# Patient Record
Sex: Male | Born: 1960 | Race: White | Hispanic: No | Smoking: Never smoker
Health system: Southern US, Community
[De-identification: ages and names within clinical notes are randomized; demographics above are authoritative.]

## PROBLEM LIST (undated history)

## (undated) HISTORY — PX: NECK SURGERY: SHX720

---

## 2006-09-20 ENCOUNTER — Emergency Department (HOSPITAL_COMMUNITY): Admission: EM | Admit: 2006-09-20 | Discharge: 2006-09-20 | Payer: Self-pay | Admitting: Emergency Medicine

## 2006-10-02 ENCOUNTER — Encounter: Admission: RE | Admit: 2006-10-02 | Discharge: 2006-10-02 | Payer: Self-pay | Admitting: Internal Medicine

## 2006-10-13 ENCOUNTER — Ambulatory Visit (HOSPITAL_COMMUNITY): Admission: RE | Admit: 2006-10-13 | Discharge: 2006-10-14 | Payer: Self-pay | Admitting: Neurosurgery

## 2008-04-03 IMAGING — CR DG CERVICAL SPINE COMPLETE 4+V
7 series · 7 of 7 positions shown · non-contrast
Comparison: none

CLINICAL DATA: Right-sided neck pain and arm numbness.
 CERVICAL SPINE ? 6 VIEW:

[w c-spine lat]
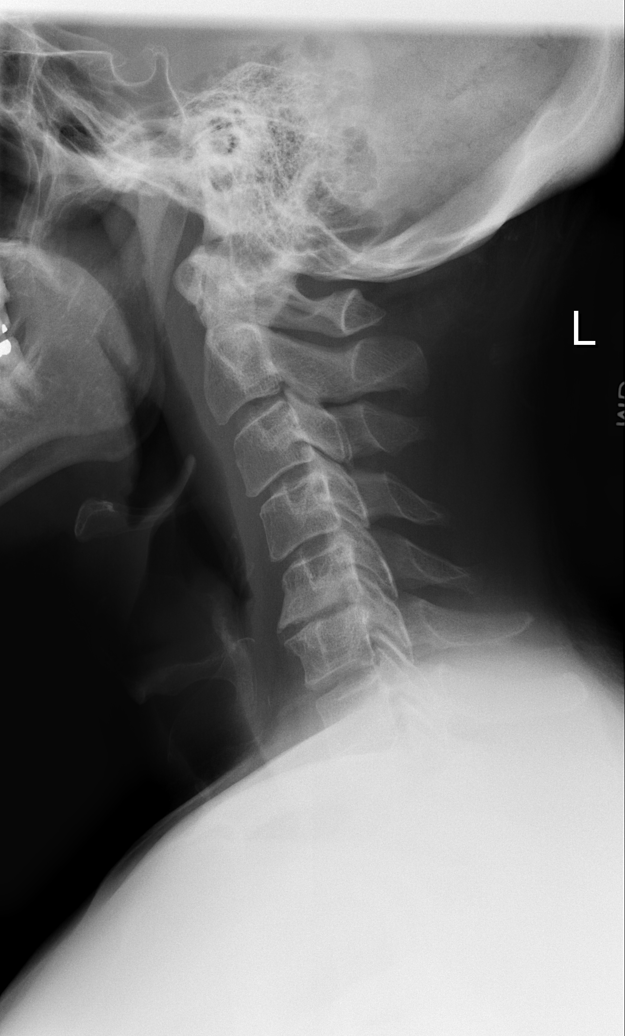

[w c-spine oblique * (1 of 2)]
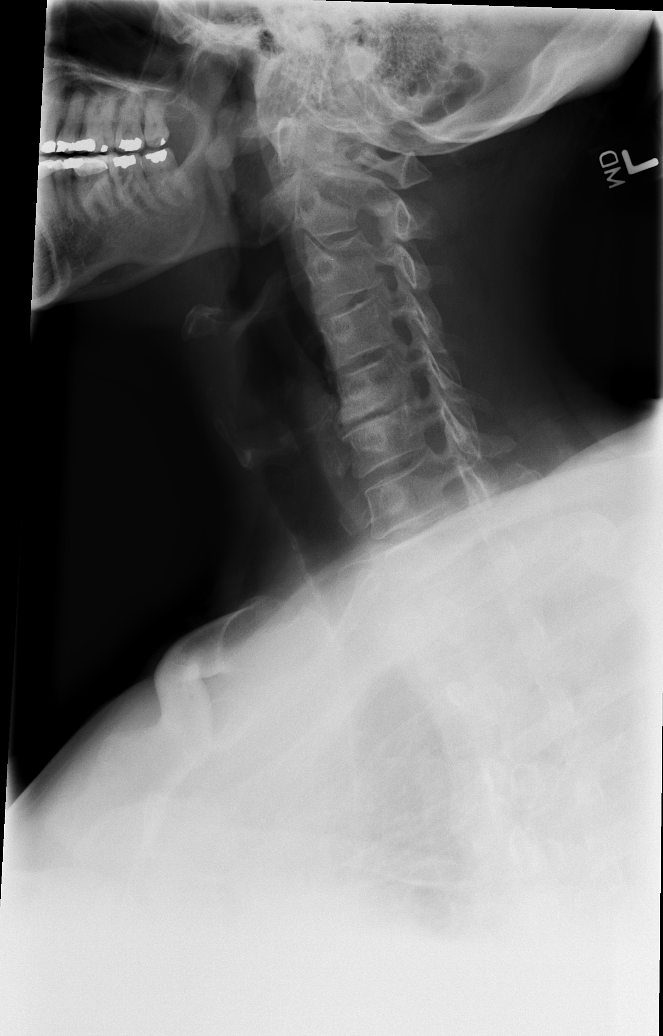

[w c-spine oblique * (2 of 2)]
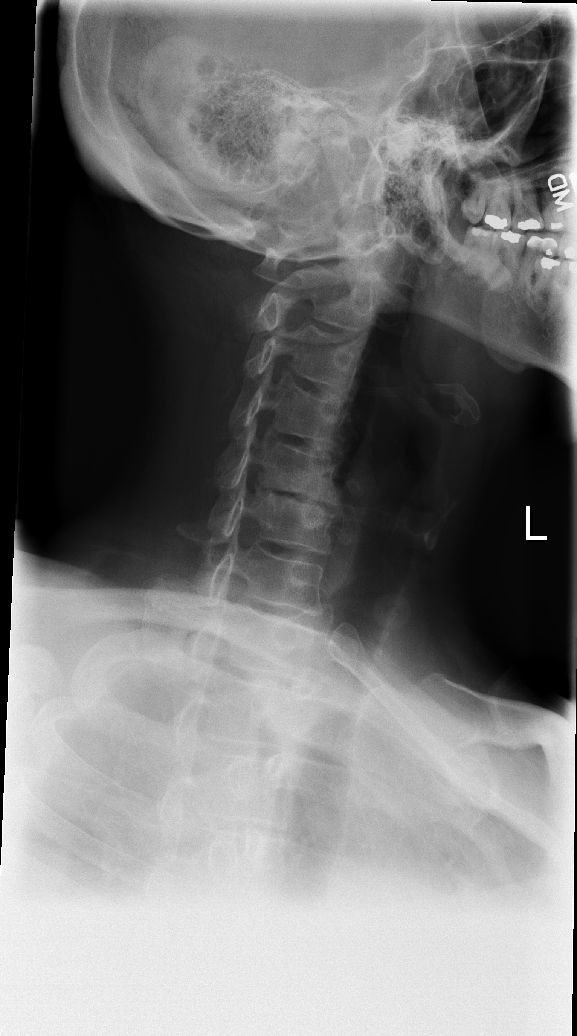

[w swimmers view]
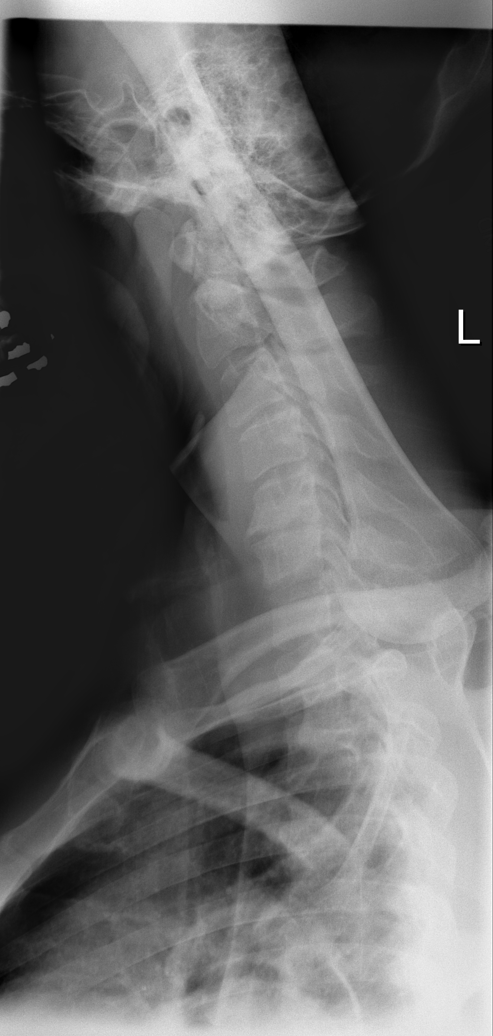

[w c-spine odontoid (1 of 2)]
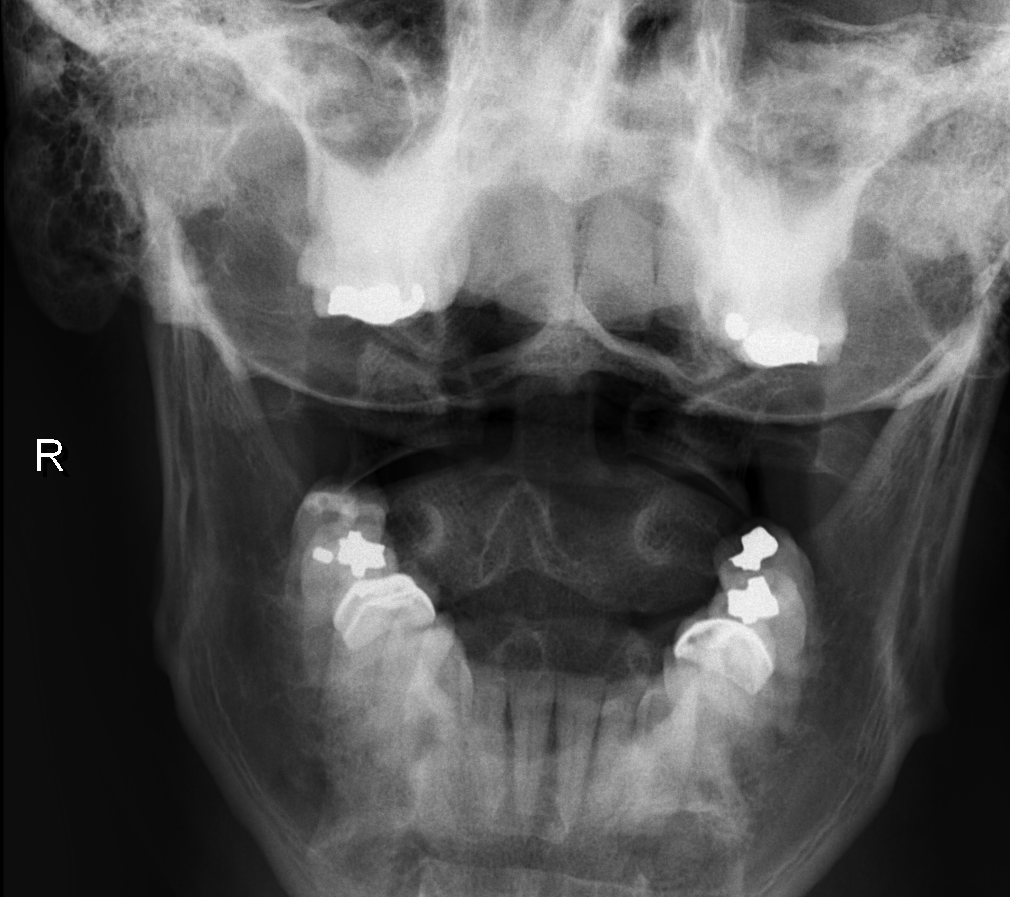

[w c-spine odontoid (2 of 2)]
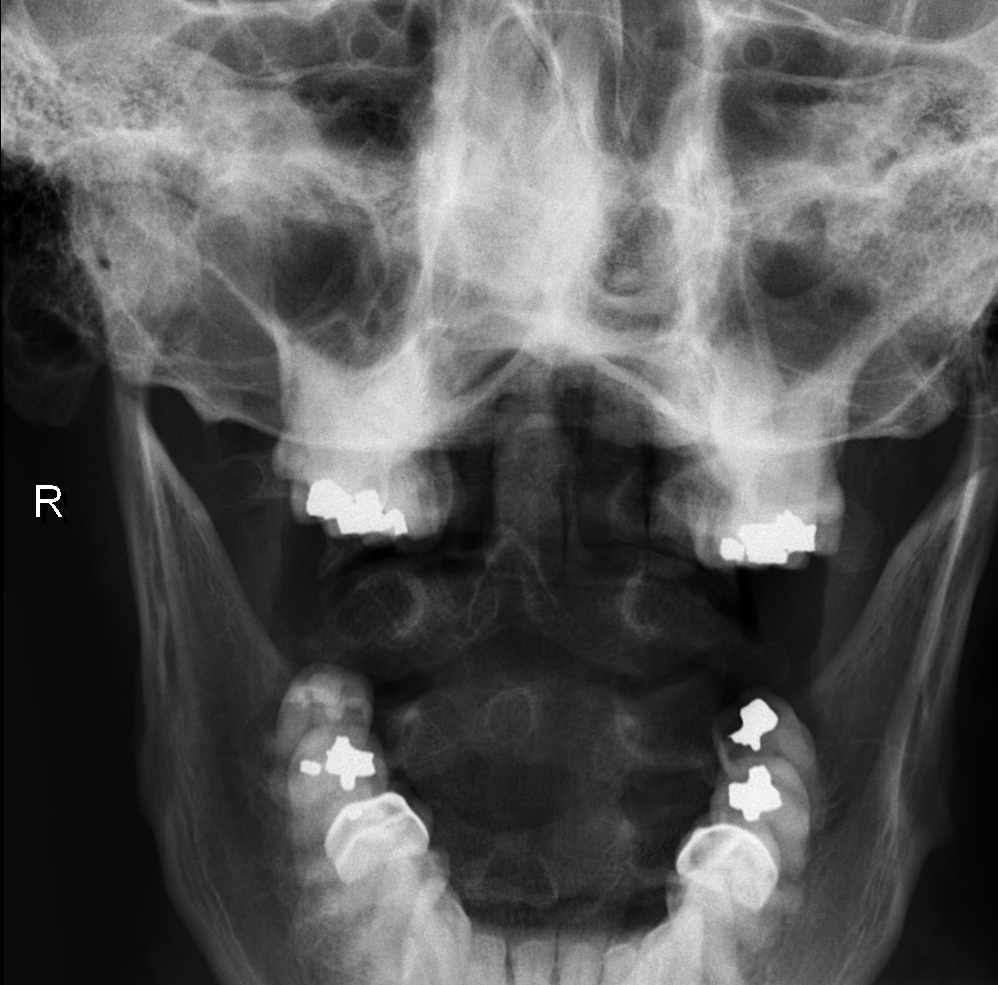

[w c-spine a.p.]
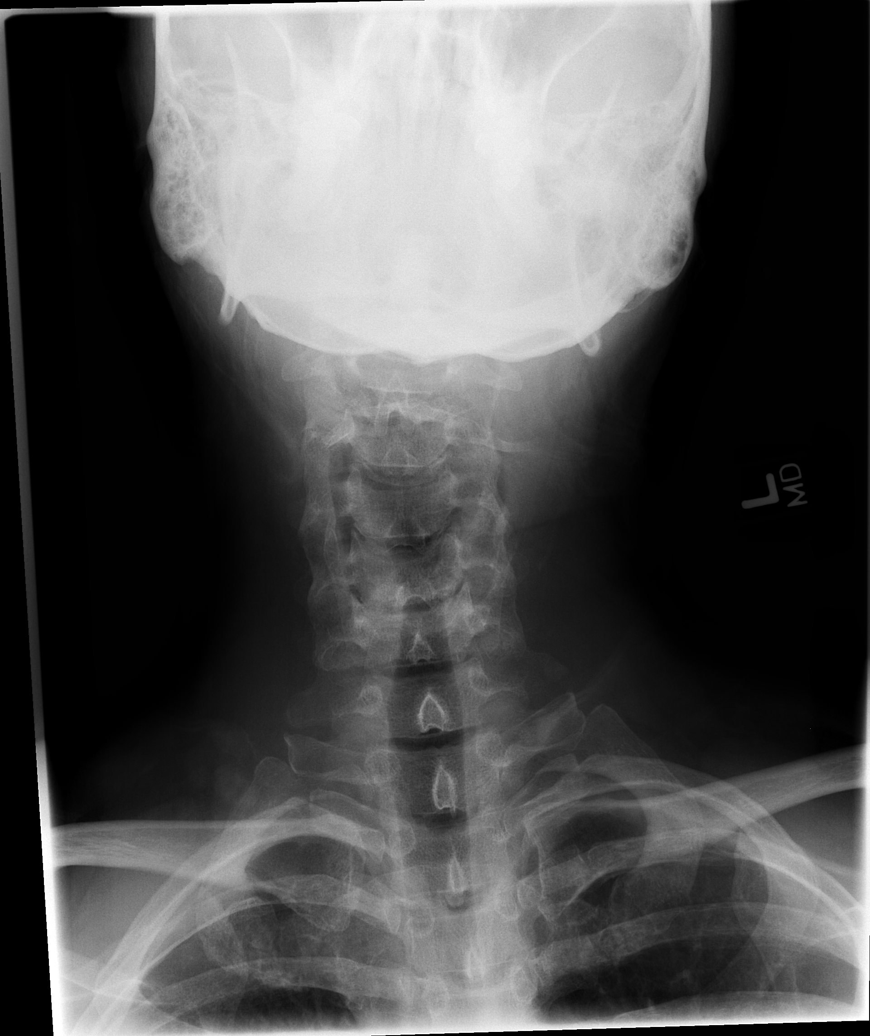

[7 of 7 positions shown; findings below may reference images not displayed]

FINDINGS: There is no evidence of cervical spine fracture, subluxation, or prevertebral soft tissue swelling.
 Moderate to severe degenerative disk disease and bilateral uncovertebral spurring is seen at C5-6.  This results in bilateral bony foraminal narrowing, right side greater than left.  No other significant bone abnormality is identified.
IMPRESSION: 1.  No acute findings.
 2.  Moderate degenerative disk disease and bilateral uncovertebral spurring at C5-6.  This results in bilateral foraminal narrowing, right side greater than left.

## 2020-10-04 ENCOUNTER — Ambulatory Visit: Payer: Self-pay | Attending: Internal Medicine

## 2020-10-04 DIAGNOSIS — Z23 Encounter for immunization: Secondary | ICD-10-CM

## 2020-10-04 NOTE — Progress Notes (Signed)
   Covid-[facility] Vaccination [facility]  Name:  Ricky Foley    MRN: [id number] DOB: [birth year]  10/04/2020  Mr. Ricky Foley was observed post Covid-19 immunization for 15 minutes without incident. He was provided with Vaccine Information Sheet and instruction to access the V-Safe system.   Mr. Ricky Foley was instructed to call 911 with any severe reactions post vaccine: Marland Kitchen Difficulty breathing  . Swelling of face and throat  . A fast heartbeat  . A bad rash all over body  . Dizziness and weakness   Immunizations Administered    No immunizations on file.

## 2023-03-07 ENCOUNTER — Ambulatory Visit (HOSPITAL_COMMUNITY)
Admission: RE | Admit: 2023-03-07 | Discharge: 2023-03-07 | Disposition: A | Discharge status: Home / Self Care | Payer: 59 | Source: Ambulatory Visit

## 2023-03-07 ENCOUNTER — Encounter (HOSPITAL_COMMUNITY): Payer: Self-pay

## 2023-03-07 VITALS — BP 180/102 | HR 99 | Temp 98.0°F | Resp 16 | Ht 69.0 in | Wt 178.0 lb

## 2023-03-07 DIAGNOSIS — H6692 Otitis media, unspecified, left ear: Secondary | ICD-10-CM | POA: Diagnosis not present

## 2023-03-07 DIAGNOSIS — R030 Elevated blood-pressure reading, without diagnosis of hypertension: Secondary | ICD-10-CM

## 2023-03-07 DIAGNOSIS — J320 Chronic maxillary sinusitis: Secondary | ICD-10-CM | POA: Diagnosis not present

## 2023-03-07 MED ORDER — CEFTRIAXONE SODIUM 1 G IJ SOLR
INTRAMUSCULAR | Status: AC
  Filled 2023-03-07: qty 10

## 2023-03-07 MED ORDER — PREDNISONE 20 MG PO TABS: 40.0000 mg | ORAL_TABLET | Freq: Every day | ORAL | 0 refills | Status: AC

## 2023-03-07 MED ORDER — AMLODIPINE BESYLATE 5 MG PO TABS: 5.0000 mg | ORAL_TABLET | Freq: Every day | ORAL | 0 refills | Status: AC

## 2023-03-07 MED ORDER — LIDOCAINE HCL (PF) 1 % IJ SOLN
INTRAMUSCULAR | Status: AC
  Filled 2023-03-07: qty 2

## 2023-03-07 MED ORDER — CEFTRIAXONE SODIUM 1 G IJ SOLR
1.0000 g | Freq: Once | INTRAMUSCULAR | Status: AC
  Administered 2023-03-07: 1 g via INTRAMUSCULAR

## 2023-03-07 NOTE — ED Triage Notes (Signed)
Patient here today with c/o Left ear pain X 10 days. He was prescribed Amoxicillin last Thursday for a middle ear infections. He had his ear cleaned out with a device and he thinks that the individual scraped his ear a little too much. He was prescribed Neomycin ear drops by his PCP on Monday. Patient is still feeling pain that increased yesterday.

## 2023-03-07 NOTE — Discharge Instructions (Signed)
It was good to meet you today.  Please finish out the last 3 tablets of the amoxicillin.  You were given an injection of Rocephin in office today, which should finish out the rest of your ear infection.  You can use the prednisone as directed to help with the ear pain and sinus pain.  Do not take any NSAIDs such as ibuprofen or Aleve with this medication.  You may take Tylenol.  You need to monitor your blood pressure at home; goal blood pressure reading is 120-130/80.  Follow-up with your primary care if continuing to trend higher than this reading.  See the handout provided on how to properly take your blood pressure readings at home.

## 2023-03-07 NOTE — ED Provider Notes (Addendum)
[facility]   MRN: [id number] DOB: 01/12/1961  Subjective:   Ricky Foley is a 62 y.o. male presenting for left-sided ear pain and fullness for greater than a week's time.  He has been taking amoxicillin as directed and using Cortisporin drops.  Denies any hearing loss or changes in hearing.  No tinnitus.  No discharge from the ear.  No fever or chills.  No headache or dizziness.  No other symptoms to report at this time.    No current facility-administered medications for this encounter.  Current Outpatient Medications:    amoxicillin (AMOXIL) 875 MG tablet, Take 875 mg by mouth 2 (two) times daily., Disp: , Rfl:    neomycin-polymyxin-hydrocortisone (CORTISPORIN) OTIC solution, 4 (four) times daily., Disp: , Rfl:    predniSONE (DELTASONE) 20 MG tablet, Take 2 tablets (40 mg total) by mouth daily with breakfast for 5 days., Disp: 10 tablet, Rfl: 0   triamcinolone cream (KENALOG) 0.1 %, Apply 1 Application topically 2 (two) times daily., Disp: , Rfl:    Not on File  History reviewed. No pertinent past medical history.   Past Surgical History:  Procedure Laterality Date   NECK SURGERY      History reviewed. No pertinent family history.  Social History   Tobacco Use   Smoking status: Never   Smokeless tobacco: Never  Vaping Use   Vaping Use: Never used  Substance Use Topics   Alcohol use: Yes    Comment: occasionally   Drug use: Never    ROS REFER TO HPI FOR PERTINENT POSITIVES AND NEGATIVES   Objective:   Vitals: BP (!) 180/102 (BP Location: Left Arm)   Pulse 99   Temp 98 F (36.7 C) (Oral)   Resp 16   Ht 5\' 9"  (1.753 m)   Wt 178 lb (80.7 kg)   SpO2 98%   BMI 26.29 kg/m   Physical Exam Vitals and nursing note reviewed.  Constitutional:      Appearance: Normal appearance.  HENT:     Right Ear: Tympanic membrane, ear canal and external ear normal.     Left Ear: Ear canal and external ear normal. No decreased hearing noted. No  drainage. A middle ear effusion is present. There is no impacted cerumen. Tympanic membrane is erythematous (mild). Tympanic membrane is not perforated, retracted or bulging.     Nose:     Left Sinus: Maxillary sinus tenderness present.  Eyes:     Extraocular Movements: Extraocular movements intact.     Conjunctiva/sclera: Conjunctivae normal.     Pupils: Pupils are equal, round, and reactive to light.  Cardiovascular:     Rate and Rhythm: Normal rate and regular rhythm.     Pulses: Normal pulses.     Heart sounds: No murmur heard. Pulmonary:     Effort: Pulmonary effort is normal.     Breath sounds: Normal breath sounds.  Neurological:     Mental Status: He is alert.     No results found for this or any previous visit (from the past 24 hour(s)).  Assessment and Plan :   PDMP not reviewed this encounter.  1. Left otitis media, unspecified otitis media type   2. Left maxillary sinusitis   3. Elevated blood pressure reading     Reassured patient that does appear that his ear infection is turning in the right direction, but still needs additional coverage.  He will finish out his last 3 tablets of amoxicillin 875 mg.  He  was also given Rocephin 1 g IM while at the urgent care today.  He will take allergy medicine over-the-counter and use nasal saline.  He can also use Flonase to help with symptoms.  Prednisone 40 mg prescribed for 5 days to help with sinus and ear pain. Pt aware of risks vs benefits and possible adverse reactions.  His blood pressure was also noted to be elevated.  This has been trending high for him in the last week with his new ear infection.  He reports he has been having anticipatory anxiety about having his blood pressure taken.  At his primary care, they were able to have normal readings after several attempts and he was not started on any blood pressure medication.  I encouraged him to continue to monitor at home and provided a handout on this.  No signs of  endorgan damage on exam.  Definitely showing signs of whitecoat syndrome here today and I suspect he will have normal readings at home.  He was encouraged to follow-up with his primary care on this topic.  Return precautions advised.  Patient agreeable and understanding to plan.   [name], [name], PA-C 03/07/23 1156   Edited to add: Patient called back about an hour after discharge nervous about his blood pressure readings. States that he was able to obtain a blood pressure cuff and still having a high reading. No symptoms. Experiencing some situational stress at home, and family hx of high blood pressure. Requesting to start on a medication until he can get back with his PCP. Agreed, and sent amlodipine 5 mg. Pt aware of risks vs benefits and possible adverse reactions. He will monitor and follow with PCP as discussed.     AllwardtCrist Infante, PA-C 03/07/23 1227

## 2023-04-02 ENCOUNTER — Other Ambulatory Visit: Payer: Self-pay | Admitting: Physician Assistant
